# Patient Record
Sex: Male | Born: 1980 | Race: White | Hispanic: No | Smoking: Former smoker
Health system: Southern US, Community
[De-identification: ages and names within clinical notes are randomized; demographics above are authoritative.]

## PROBLEM LIST (undated history)

## (undated) HISTORY — PX: SINUS EXPLORATION: SHX5214

---

## 2015-10-03 ENCOUNTER — Emergency Department (HOSPITAL_COMMUNITY)
Admission: EM | Admit: 2015-10-03 | Discharge: 2015-10-03 | Disposition: A | Discharge status: Home / Self Care | Attending: Emergency Medicine | Admitting: Emergency Medicine

## 2015-10-03 ENCOUNTER — Encounter (HOSPITAL_COMMUNITY): Payer: Self-pay | Admitting: Emergency Medicine

## 2015-10-03 ENCOUNTER — Emergency Department (HOSPITAL_COMMUNITY)

## 2015-10-03 DIAGNOSIS — W2201XA Walked into wall, initial encounter: Secondary | ICD-10-CM | POA: Diagnosis not present

## 2015-10-03 DIAGNOSIS — S0990XA Unspecified injury of head, initial encounter: Secondary | ICD-10-CM | POA: Diagnosis not present

## 2015-10-03 DIAGNOSIS — Y999 Unspecified external cause status: Secondary | ICD-10-CM | POA: Insufficient documentation

## 2015-10-03 DIAGNOSIS — Y9389 Activity, other specified: Secondary | ICD-10-CM | POA: Diagnosis not present

## 2015-10-03 DIAGNOSIS — Y929 Unspecified place or not applicable: Secondary | ICD-10-CM | POA: Insufficient documentation

## 2015-10-03 DIAGNOSIS — Z87891 Personal history of nicotine dependence: Secondary | ICD-10-CM | POA: Diagnosis not present

## 2015-10-03 NOTE — Discharge Instructions (Signed)
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
## 2017-04-16 ENCOUNTER — Other Ambulatory Visit: Payer: Self-pay

## 2017-04-16 ENCOUNTER — Emergency Department (HOSPITAL_COMMUNITY)
Admission: EM | Admit: 2017-04-16 | Discharge: 2017-04-16 | Disposition: A | Discharge status: Home / Self Care | Attending: Emergency Medicine | Admitting: Emergency Medicine

## 2017-04-16 ENCOUNTER — Encounter (HOSPITAL_COMMUNITY): Payer: Self-pay | Admitting: Emergency Medicine

## 2017-04-16 DIAGNOSIS — Z87891 Personal history of nicotine dependence: Secondary | ICD-10-CM | POA: Insufficient documentation

## 2017-04-16 DIAGNOSIS — F1010 Alcohol abuse, uncomplicated: Secondary | ICD-10-CM | POA: Insufficient documentation

## 2017-04-16 LAB — ETHANOL

## 2017-04-16 LAB — COMPREHENSIVE METABOLIC PANEL
ALBUMIN: 4.4 g/dL (ref 3.5–5.0)
ALK PHOS: 44 U/L (ref 38–126)
ALT: 22 U/L (ref 17–63)
AST: 33 U/L (ref 15–41)
Anion gap: 13 (ref 5–15)
BILIRUBIN TOTAL: 1.1 mg/dL (ref 0.3–1.2)
BUN: 11 mg/dL (ref 6–20)
CO2: 22 mmol/L (ref 22–32)
CREATININE: 0.91 mg/dL (ref 0.61–1.24)
Calcium: 9.2 mg/dL (ref 8.9–10.3)
Chloride: 103 mmol/L (ref 101–111)
GFR calc Af Amer: >60 mL/min (ref >60)
GLUCOSE: 234 mg/dL — AB (ref 65–99)
POTASSIUM: 4.1 mmol/L (ref 3.5–5.1)
Sodium: 138 mmol/L (ref 135–145)
TOTAL PROTEIN: 7.6 g/dL (ref 6.5–8.1)

## 2017-04-16 LAB — CBC
HCT: 46.4 % (ref 39.0–52.0)
HEMOGLOBIN: 15.2 g/dL (ref 13.0–17.0)
MCH: 32 pg (ref 26.0–34.0)
MCHC: 32.8 g/dL (ref 30.0–36.0)
MCV: 97.7 fL (ref 78.0–100.0)
Platelets: 300 10*3/uL (ref 150–400)
RBC: 4.75 MIL/uL (ref 4.22–5.81)
RDW: 12.5 % (ref 11.5–15.5)
WBC: 6.4 10*3/uL (ref 4.0–10.5)

## 2017-04-16 LAB — RAPID URINE DRUG SCREEN, HOSP PERFORMED
Amphetamines: NOT DETECTED
BARBITURATES: NOT DETECTED
Benzodiazepines: NOT DETECTED
COCAINE: POSITIVE — AB
OPIATES: NOT DETECTED
TETRAHYDROCANNABINOL: POSITIVE — AB

## 2017-04-16 NOTE — ED Notes (Signed)
Ride has shown up; cancelled dinner tray

## 2017-04-16 NOTE — ED Triage Notes (Signed)
Pt states he was accepted at [facility] for long term treatment of ETOH abuse, but states they required he be detoxed prior to coming there. Pt reports his last alcoholic drink was last night/early this morning. Pt reports drinking ~18 beers/ per day. Denies hx of withdrawal or withdrawal seizure

## 2017-04-16 NOTE — Discharge Instructions (Signed)
Patient is medically cleared for alcohol abuse treatment.  Your blood sugar was mildly elevated in the ER today, please have this rechecked with your regular doctor.

## 2017-04-16 NOTE — ED Notes (Signed)
Pt called for vitals X 3. No answer. Will call again. 

## 2017-04-16 NOTE — ED Notes (Signed)
Ordered dinner tray.  

## 2017-04-16 NOTE — ED Provider Notes (Signed)
[facility] EMERGENCY DEPARTMENT Provider Note   CSN: [id number] Arrival date & time: 04/16/17  1211     History   Chief Complaint Chief Complaint  Patient presents with  . Medical Clearance    HPI Jesse Hull is a 37 y.o. male.  HPI  Mr. Jesse Hull is a 37 year old male with a history of alcohol abuse who presents to the emergency department for medical clearance.  He was sent here from  [address]" for medical clearance so that he can begin long-term alcohol abuse treatment.  Patient states that he drinks approximately 12-18 beers per day intermittently for the past several years now.  States that his last alcoholic beverage was yesterday evening.  Reports that he has never had withdrawal tremors or seizure in the past. States that he was incarcerated for 2.5 weeks in November and had no withdrawal problems.  He states that he also used cocaine about 4 days ago.  Intermittently uses marijuana.  Has no complaints.  He denies fevers, chills, headache, abdominal pain, nausea/vomiting, chest pain, shortness of breath.  Denies suicidal or homicidal ideation.  History reviewed. No pertinent past medical history.  There are no active problems to display for this patient.   Past Surgical History:  Procedure Laterality Date  . SINUS EXPLORATION         Home Medications    Prior to Admission medications   Medication Sig Start Date End Date Taking? Authorizing Provider  acetaminophen (TYLENOL) 325 MG tablet Take 325 mg by mouth every 6 (six) hours as needed for moderate pain.    [provider]    Family History No family history on file.  Social History Social History   Tobacco Use  . Smoking status: Former Games developer  . Smokeless tobacco: Never Used  Substance Use Topics  . Alcohol use: Yes    Comment: 12 beers a day on weekdays, 18-24 beers per day  on weekend  . Drug use: Yes    Types: Cocaine     Allergies   Patient has no  known allergies.   Review of Systems Review of Systems  Constitutional: Negative for chills and fever.  Eyes: Negative for visual disturbance.  Respiratory: Negative for shortness of breath.   Cardiovascular: Negative for chest pain.  Gastrointestinal: Negative for abdominal pain, diarrhea, nausea and vomiting.  Genitourinary: Negative for dysuria.  Musculoskeletal: Negative for gait problem.  Skin: Negative for rash.  Neurological: Negative for dizziness, weakness, light-headedness, numbness and headaches.  Psychiatric/Behavioral: Negative for self-injury and suicidal ideas.     Physical Exam Updated Vital Signs BP 119/81   Pulse 82   Temp 98.9 F (37.2 C) (Oral)   Resp 16   SpO2 100%   Physical Exam  Constitutional: He is oriented to person, place, and time. He appears well-developed and well-nourished. No distress.  Patient sitting at bedside in no apparent distress.  HENT:  Head: Normocephalic and atraumatic.  Mouth/Throat: Oropharynx is clear and moist. No oropharyngeal exudate.  Eyes: Conjunctivae are normal. Pupils are equal, round, and reactive to light. Right eye exhibits no discharge. Left eye exhibits no discharge.  Neck: Normal range of motion. Neck supple.  Cardiovascular: Normal rate, regular rhythm and intact distal pulses. Exam reveals no friction rub.  No murmur heard. Pulmonary/Chest: Effort normal and breath sounds normal. No stridor. No respiratory distress. He has no wheezes. He has no rales.  Abdominal: Soft. Bowel sounds are normal. There is no tenderness. There is no guarding.  Musculoskeletal: Normal range of motion.  Lymphadenopathy:    He has no cervical adenopathy.  Neurological: He is alert and oriented to person, place, and time. Coordination normal.  No tremors with outstretched hands.  Gait normal and coordination and balance.  Skin: Skin is warm and dry. Capillary refill takes less than 2 seconds. He is not diaphoretic.  Psychiatric: He  has a normal mood and affect. His behavior is normal.  Nursing note and vitals reviewed.    ED Treatments / Results  Labs (all labs ordered are listed, but only abnormal results are displayed) Labs Reviewed  COMPREHENSIVE METABOLIC PANEL - Abnormal; Notable for the following components:      Result Value   Glucose, Bld 234 (*)    All other components within normal limits  RAPID URINE DRUG SCREEN, HOSP PERFORMED - Abnormal; Notable for the following components:   Cocaine POSITIVE (*)    Tetrahydrocannabinol POSITIVE (*)    All other components within normal limits  ETHANOL  CBC    EKG  EKG Interpretation None       Radiology No results found.  Procedures Procedures (including critical care time)  Medications Ordered in ED Medications - No data to display   Initial Impression / Assessment and Plan / ED Course  I have reviewed the triage vital signs and the nursing notes.  Pertinent labs & imaging results that were available during my care of the patient were reviewed by me and considered in my medical decision making (see chart for details).    Patient presents for medical clearance so that he may begin long-term alcohol inpatient treatment.  He has no complaints.  On exam he is nontoxic and in no acute distress.  Vital signs stable.  No tremors on exam. Patient denies headache, abdominal pain, n/v, palpitations. No signs of active withdrawal.    Lab work reviewed. CMP reveals elevated glucose of 234, patient states he has had elevated blood sugar in the past. Counseled him to have this rechecked with his PCP and patient agrees. CBC WNL. Ethanol negative. Rapid UDS positive for cocaine and THC.  Patient is medically cleared. [facility] who states that he will accept patient into long term EtOH treatment given patient has been medically cleared. He was picked up from the emergency department by facility members.  Counseled patient on  return precautions and he agrees and voiced understanding to the above plan.  Discussed this patient with Dr. Adela LankFloyd who agrees with plan and discharge.   Final Clinical Impressions(s) / ED Diagnoses   Final diagnoses:  Alcohol abuse    ED Discharge Orders    None       Kellie ShropshireShrosbree, Emily J, PA-C 04/17/17 0008    Melene PlanFloyd, Dan, DO 04/17/17 [phone]
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
